# Patient Record
Sex: Male | Born: 1965 | Race: White | Hispanic: No | Marital: Single
Health system: Southern US, Community
[De-identification: ages and names within clinical notes are randomized; demographics above are authoritative.]

---

## 2021-02-22 ENCOUNTER — Emergency Department (HOSPITAL_COMMUNITY): Payer: 59

## 2021-02-22 ENCOUNTER — Encounter (HOSPITAL_COMMUNITY): Payer: Self-pay

## 2021-02-22 ENCOUNTER — Emergency Department (HOSPITAL_COMMUNITY)
Admission: EM | Admit: 2021-02-22 | Discharge: 2021-02-23 | Disposition: A | Discharge status: Home / Self Care | Payer: 59 | Attending: Physician Assistant | Admitting: Physician Assistant

## 2021-02-22 DIAGNOSIS — Z5321 Procedure and treatment not carried out due to patient leaving prior to being seen by health care provider: Secondary | ICD-10-CM | POA: Diagnosis not present

## 2021-02-22 DIAGNOSIS — R079 Chest pain, unspecified: Secondary | ICD-10-CM | POA: Diagnosis present

## 2021-02-22 LAB — CBC WITH DIFFERENTIAL/PLATELET
Abs Immature Granulocytes: 0.01 10*3/uL (ref 0.00–0.07)
Basophils Absolute: 0 10*3/uL (ref 0.0–0.1)
Basophils Relative: 1 %
Eosinophils Absolute: 0 10*3/uL (ref 0.0–0.5)
Eosinophils Relative: 1 %
HCT: 44.9 % (ref 39.0–52.0)
Hemoglobin: 15.5 g/dL (ref 13.0–17.0)
Immature Granulocytes: 0 %
Lymphocytes Relative: 22 %
Lymphs Abs: 1.3 10*3/uL (ref 0.7–4.0)
MCH: 31.1 pg (ref 26.0–34.0)
MCHC: 34.5 g/dL (ref 30.0–36.0)
MCV: 90 fL (ref 80.0–100.0)
Monocytes Absolute: 0.5 10*3/uL (ref 0.1–1.0)
Monocytes Relative: 9 %
Neutro Abs: 4.1 10*3/uL (ref 1.7–7.7)
Neutrophils Relative %: 67 %
Platelets: 212 10*3/uL (ref 150–400)
RBC: 4.99 MIL/uL (ref 4.22–5.81)
RDW: 11.9 % (ref 11.5–15.5)
WBC: 6 10*3/uL (ref 4.0–10.5)
nRBC: 0 % (ref 0.0–0.2)

## 2021-02-22 LAB — TROPONIN I (HIGH SENSITIVITY)
Troponin I (High Sensitivity): <2 ng/L (ref <18)
Troponin I (High Sensitivity): 2 ng/L (ref <18)

## 2021-02-22 LAB — BASIC METABOLIC PANEL
Anion gap: 8 (ref 5–15)
BUN: 17 mg/dL (ref 6–20)
CO2: 28 mmol/L (ref 22–32)
Calcium: 9.3 mg/dL (ref 8.9–10.3)
Chloride: 101 mmol/L (ref 98–111)
Creatinine, Ser: 0.9 mg/dL (ref 0.61–1.24)
GFR, Estimated: >60 mL/min (ref >60)
Glucose, Bld: 106 mg/dL — ABNORMAL HIGH (ref 70–99)
Potassium: 4.2 mmol/L (ref 3.5–5.1)
Sodium: 137 mmol/L (ref 135–145)

## 2021-02-22 LAB — CBG MONITORING, ED: Glucose-Capillary: 95 mg/dL (ref 70–99)

## 2021-02-22 NOTE — ED Provider Triage Note (Signed)
Emergency Medicine Provider Triage Evaluation Note  Albert Johnston , a 56 y.o. male  was evaluated in triage.  Pt complains of chest pain, began Tuesday.  Intermittent in nature however now more consistent.  Feels like someone's hand is squeezing his left chest.  Does not radiate to left back, left jaw or arm.  Does drive 1 hour to work each way however no lower extremity pain, swelling.  No history of PE or DVT.  Has felt lightheaded, shaky.  Some nausea without vomiting.  No paresthesias, unilateral weakness, difficulty with word finding. Recently started on blood pressure medication  Review of Systems  Positive: Chest pain, lightheadedness, shakiness Negative: Syncope, paresthesias  Physical Exam  There were no vitals taken for this visit. Gen:   Awake, no distress   Resp:  Normal effort  MSK:   Moves extremities without difficulty, no lower extremity edema Other:    Medical Decision Making  Medically screening exam initiated at 3:54 PM.  Appropriate orders placed.  Albert Johnston was informed that the remainder of the evaluation will be completed by another provider, this initial triage assessment does not replace that evaluation, and the importance of remaining in the ED until their evaluation is complete.  Chest pain   Albert Johnston A, Johnston-C 02/22/21 1556

## 2021-02-22 NOTE — ED Triage Notes (Signed)
Pt arrived POV, c/o left sided chest pain and dizziness with some associated SOB, has had reoccurring episodes of same with no dx.

## 2023-01-06 IMAGING — CR DG CHEST 2V
2 series · 2 of 2 positions shown · non-contrast
Comparison: No pertinent prior exams available for comparison.

CLINICAL DATA: Chest pain. Additional history provided: Left-sided
chest pain and dizziness with shortness of breath.

EXAM:
CHEST - 2 VIEW

[w chest lat]
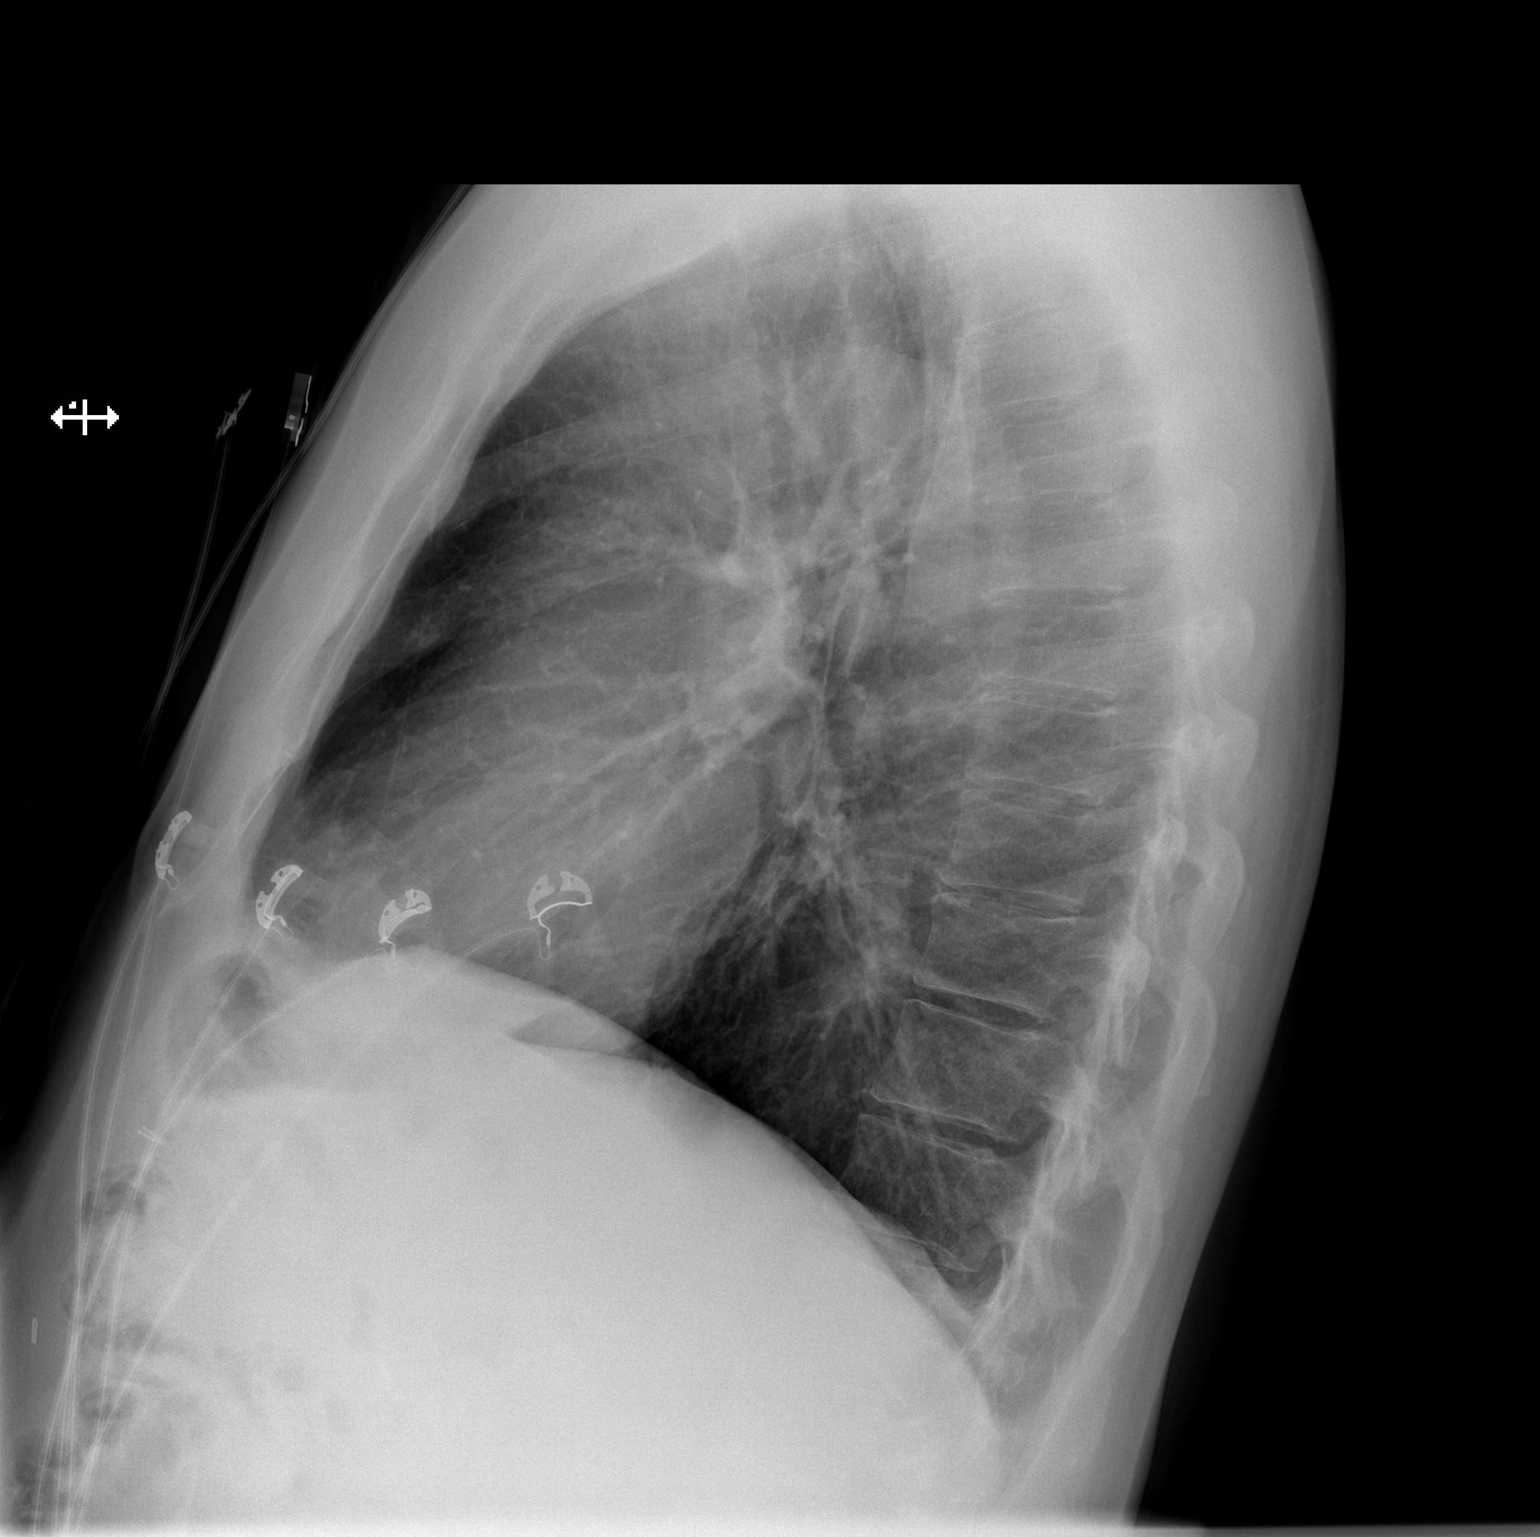

[w chest pa]
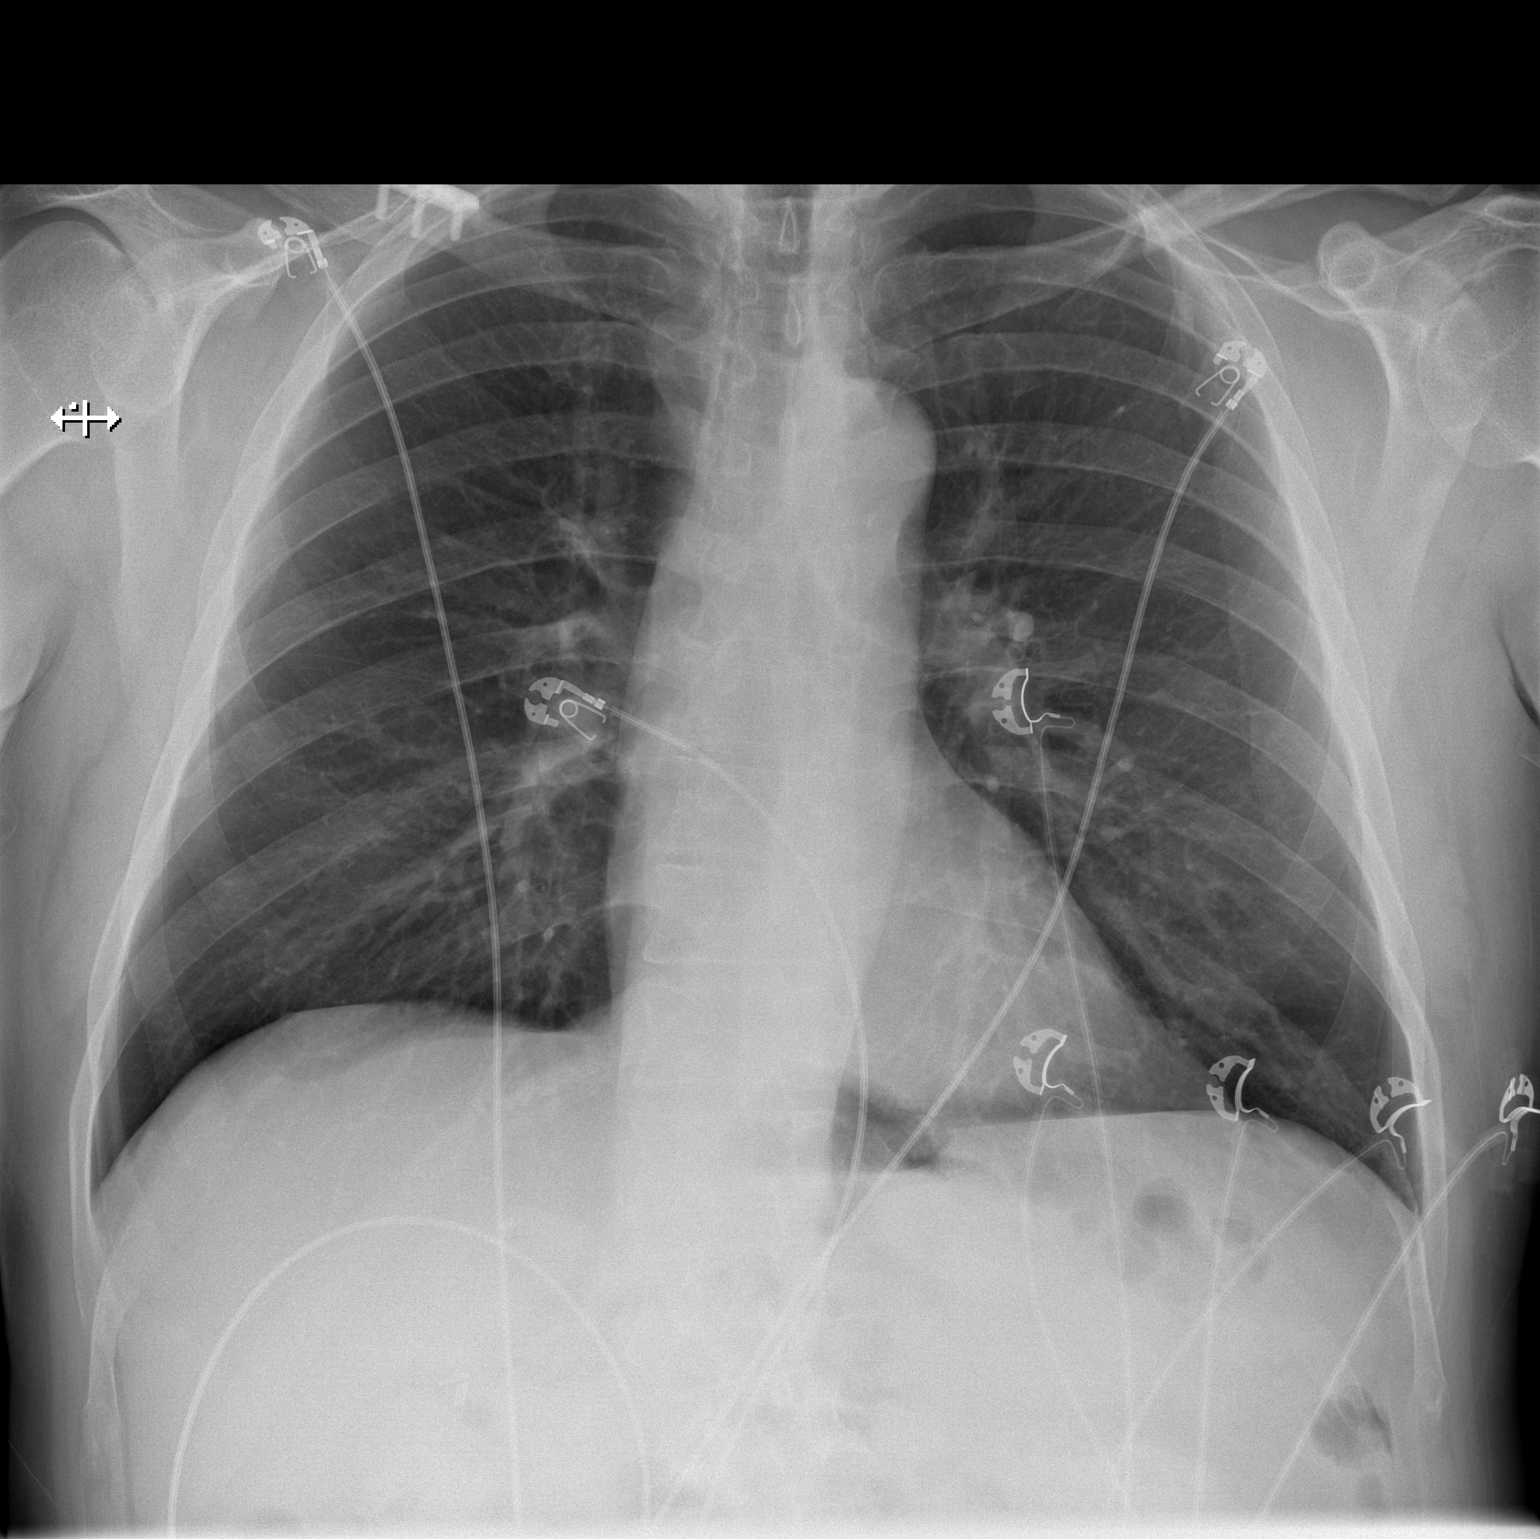

[2 of 2 positions shown; findings below may reference images not displayed]

FINDINGS: Heart size within normal limits. No appreciable airspace
consolidation. No evidence of pleural effusion or pneumothorax. No
acute bony abnormality identified. A metallic plate traverses a
chronic fracture deformity of the right clavicle. A small hiatal
hernia is questioned.
IMPRESSION: No evidence of active cardiopulmonary disease.

A small hiatal hernia is questioned.
